# Patient Record
Sex: Male | Born: 1990 | Race: White | Hispanic: No | Marital: Single | Smoking: Current every day smoker
Health system: Southern US, Community
[De-identification: ages and names within clinical notes are randomized; demographics above are authoritative.]

## PROBLEM LIST (undated history)

## (undated) DIAGNOSIS — F909 Attention-deficit hyperactivity disorder, unspecified type: Secondary | ICD-10-CM

---

## 2015-11-11 ENCOUNTER — Encounter (HOSPITAL_COMMUNITY): Payer: Self-pay | Admitting: Emergency Medicine

## 2015-11-11 ENCOUNTER — Emergency Department (HOSPITAL_COMMUNITY)
Admission: EM | Admit: 2015-11-11 | Discharge: 2015-11-12 | Disposition: A | Discharge status: Home / Self Care | Payer: 59 | Attending: Emergency Medicine | Admitting: Emergency Medicine

## 2015-11-11 DIAGNOSIS — F909 Attention-deficit hyperactivity disorder, unspecified type: Secondary | ICD-10-CM | POA: Insufficient documentation

## 2015-11-11 DIAGNOSIS — F17200 Nicotine dependence, unspecified, uncomplicated: Secondary | ICD-10-CM | POA: Insufficient documentation

## 2015-11-11 DIAGNOSIS — R55 Syncope and collapse: Secondary | ICD-10-CM

## 2015-11-11 HISTORY — DX: Attention-deficit hyperactivity disorder, unspecified type: F90.9

## 2015-11-11 NOTE — ED Provider Notes (Signed)
[facility]-EMERGENCY DEPT Provider Note   CSN: [id number] Arrival date & time: 11/11/15  2322  By signing my name below, I, Phillis HaggisGabriella Gaje, attest that this documentation has been prepared under the direction and in the presence of Lyndal Pulleyaniel Nikelle Malatesta, MD. Electronically Signed: Phillis HaggisGabriella Gaje, ED Scribe. 11/11/15. 11:51 PM.  History   Chief Complaint Chief Complaint  Patient presents with  . Alcohol Intoxication    Syncope   The history is provided by the patient. No language interpreter was used.   HPI Comments: Jon Wolfe is a 25 y.o. male brought in by EMS who presents to the Emergency Department complaining of a brief episode of syncope s/p heavy alcohol use occurring PTA. Pt says that he was drinking all day and running around playing disc golf. His friend says that he has not eaten since noon. Friend called EMS because pt was urinating when he fell over and unaware of what he was doing. Pt says that he has had episodes in the past where he would "get up too fast then fall out," similar to his presentation tonight. Pt denies chest pain, abdominal pain, nausea, vomiting, or SOB.   Past Medical History:  Diagnosis Date  . ADHD     There are no active problems to display for this patient.   History reviewed. No pertinent surgical history.   Home Medications    Prior to Admission medications   Not on File    Family History No family history on file.  Social History Social History  Substance Use Topics  . Smoking status: Current Every Day Smoker  . Smokeless tobacco: Never Used  . Alcohol use Yes    Allergies   Patient has no known allergies.  Review of Systems Review of Systems  Respiratory: Negative for shortness of breath.   Cardiovascular: Negative for chest pain.  Gastrointestinal: Negative for abdominal pain, nausea and vomiting.  Neurological: Positive for syncope.  All other systems reviewed and are negative.  Physical Exam Updated Vital Signs BP  149/84 (BP Location: Right Arm)   Pulse (!) 57   Temp 98.4 F (36.9 C) (Oral)   Resp 16   Ht 6\' 5"  (1.956 m)   Wt 168 lb (76.2 kg)   SpO2 100%   BMI 19.92 kg/m   Physical Exam  Constitutional: He is oriented to person, place, and time. He appears well-developed and well-nourished. No distress.  HENT:  Head: Normocephalic and atraumatic.  Nose: Nose normal.  Eyes: Conjunctivae and EOM are normal. Pupils are equal, round, and reactive to light.  Neck: Neck supple. No tracheal deviation present.  Cardiovascular: Normal rate, regular rhythm, S1 normal, S2 normal, normal heart sounds and intact distal pulses.   No murmur heard. Pulmonary/Chest: Effort normal. No respiratory distress.  Abdominal: Soft. He exhibits no distension. There is no tenderness.  Neurological: He is alert and oriented to person, place, and time.  Skin: Skin is warm and dry.  Psychiatric: He has a normal mood and affect.  Nursing note and vitals reviewed.  ED Treatments / Results  DIAGNOSTIC STUDIES: Oxygen Saturation is 100% on RA, normal by my interpretation.    COORDINATION OF CARE: 11:50 PM-Discussed treatment plan which includes EKG with pt at bedside and pt agreed to plan.   Labs (all labs ordered are listed, but only abnormal results are displayed) Labs Reviewed - No data to display  EKG  EKG Interpretation  Date/Time:  Friday November 11 2015 23:52:32 EST Ventricular Rate:  76 PR Interval:  QRS Duration: 98 QT Interval:  434 QTC Calculation: 488 R Axis:   79 Text Interpretation:  Sinus rhythm Benign early repolarization Normal ECG Confirmed by Staci Carver MD, Rylyn Zawistowski ([id number]) on 11/11/2015 11:5([id number] PM       Radiology No results found.  Procedures Procedures (including critical care time)  Medications Ordered in ED Medications - No data to display   Initial Impression / Assessment and Plan / ED Course  I have reviewed the triage vital signs and the nursing notes.  Pertinent labs &  imaging results that were available during my care of the patient were reviewed by me and considered in my medical decision making (see chart for details).  Clinical Course     25 y.o. male presents with Syncopal event where he had been drinking wine and beer for most of the day while walking through the woods playing disc golf. He was urinating outside and was around friends, fell backwards and lost consciousness briefly and began moaning and urinated on himself. EKG interpreted by me without ST or T wave changes concerning for myocardial ischemia. No delta wave, no prolonged QTc, no brugada to suggest arrhythmogenicity.  He is well-appearing with a normal neurologic exam on arrival, tolerated fluids by mouth and was ambulatory prior to discharge. This likely represents an element of dehydration versus micturition syncope in a low risk demographic patient is otherwise healthy. Plan to follow up with PCP as needed and return precautions discussed for worsening or new concerning symptoms.   Final Clinical Impressions(s) / ED Diagnoses   Final diagnoses:  Syncope and collapse   I personally performed the services described in this documentation, which was scribed in my presence. The recorded information has been reviewed and is accurate.    New Prescriptions New Prescriptions   No medications on file     Lyndal Pulleyaniel Furqan Gosselin, MD 11/12/15 [phone]

## 2015-11-11 NOTE — ED Triage Notes (Signed)
Patient reports brief syncopal episode this evening and heavy ( ETOH) drinking today , alert and oriented at arrival , denies SOB , no pain or discomfort . No nausea or emesis .

## 2015-11-12 NOTE — ED Notes (Signed)
Paper scrubs / non slip socks given to pt.

## 2017-11-24 IMAGING — CT CT ABD-PELV W/ CM
2 of 4 series · 15 of 46 positions shown, 17 images · IV contrast (APPLIED)
Comparison: None.

CLINICAL DATA: Acute onset of right inguinal swelling and pain.
Initial encounter.

EXAM:
CT ABDOMEN AND PELVIS WITH CONTRAST
TECHNIQUE: Multidetector CT imaging of the abdomen and pelvis was performed
using the standard protocol following bolus administration of
intravenous contrast.
CONTRAST:  100mL [id number] IOPAMIDOL ([id number]) INJECTION 61%

[Series 2: abd/ pelvis 5.0 i30f 1 · axial · 0.87mm/px · z∈[+719,+1159]mm · 12 of 96 slices shown, 14 images]
[im 4/96  soft-tissue]
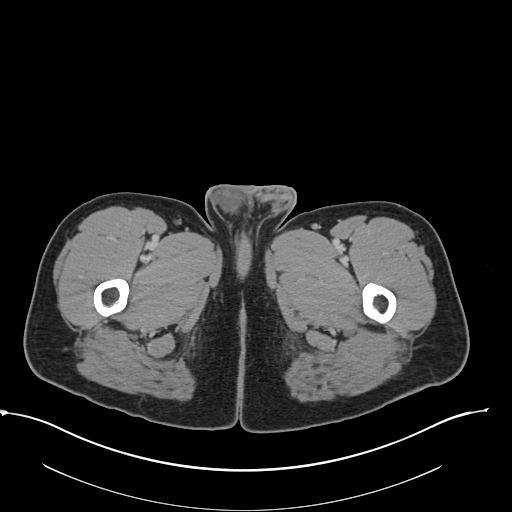
[im 4/96  bone]
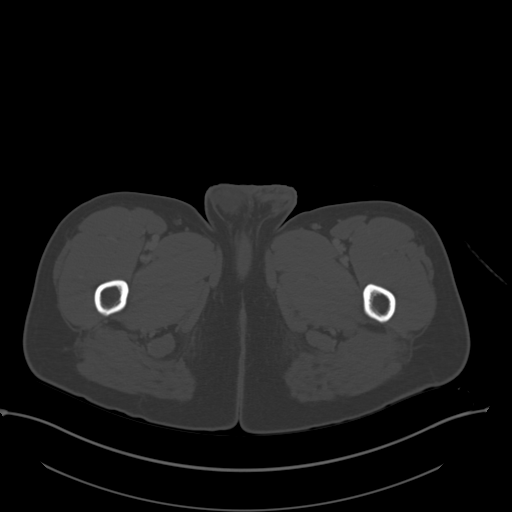
[im 12/96  soft-tissue]
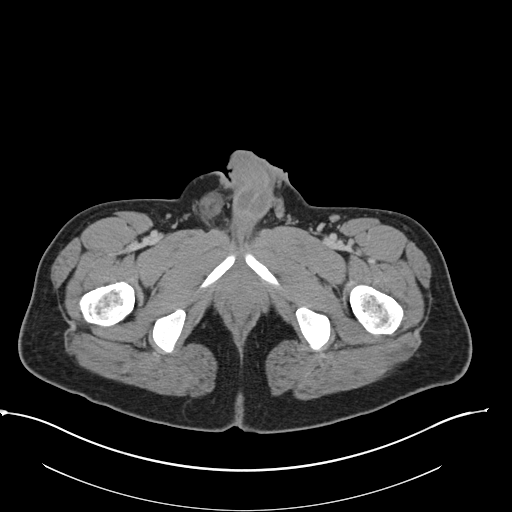
[im 20/96  soft-tissue]
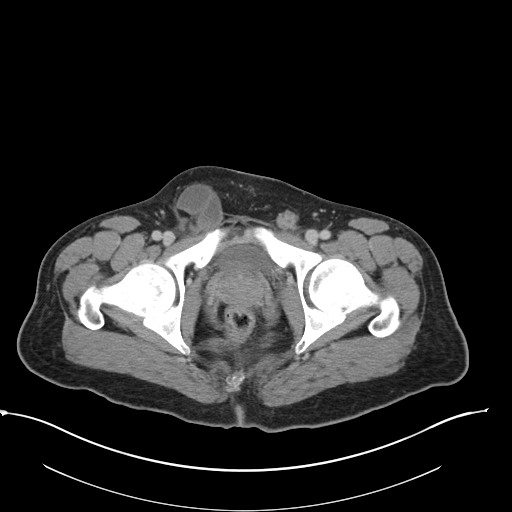
[im 28/96  soft-tissue]
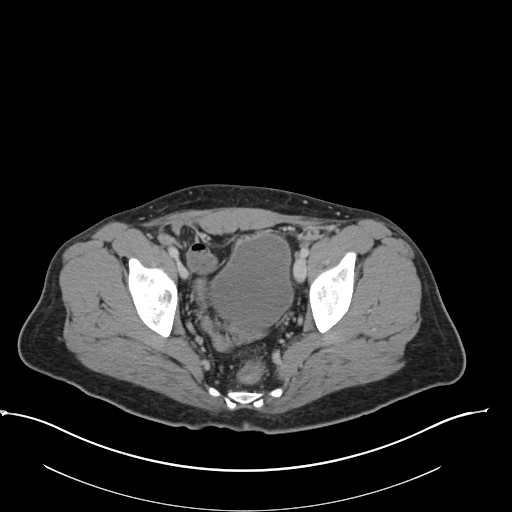
[im 36/96  soft-tissue]
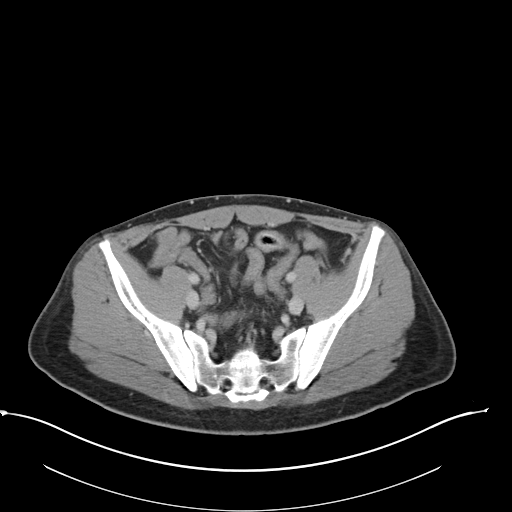
[im 44/96  soft-tissue]
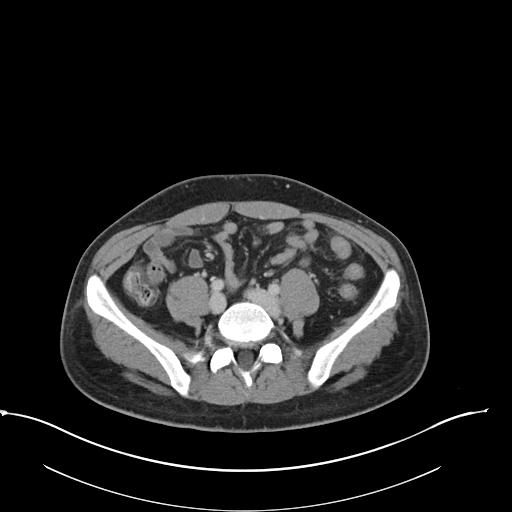
[im 52/96  soft-tissue]
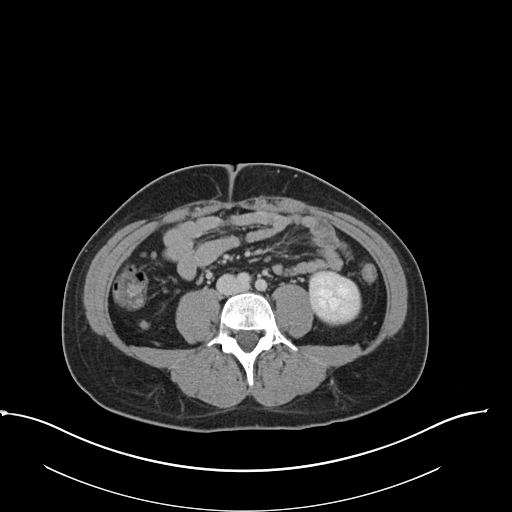
[im 60/96  soft-tissue]
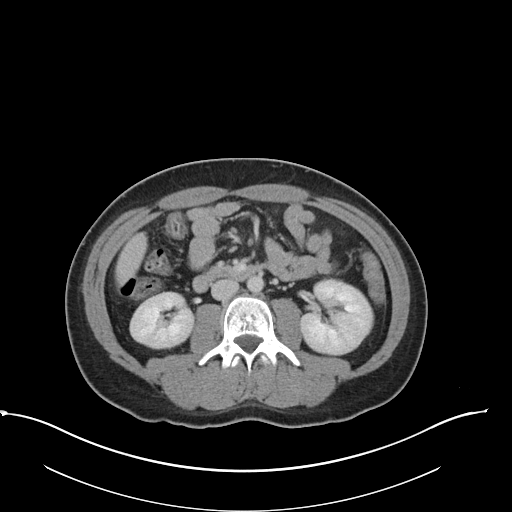
[im 68/96  soft-tissue]
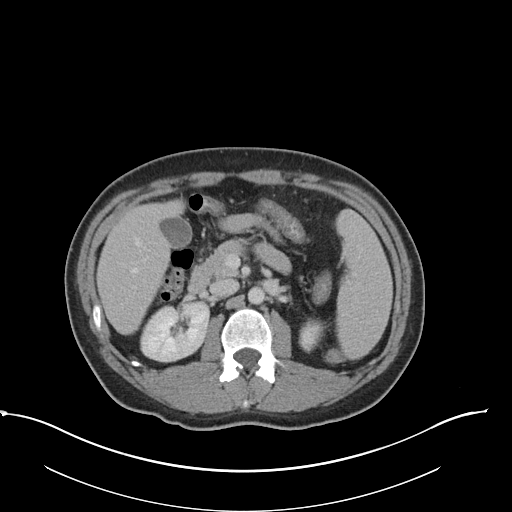
[im 68/96  bone]
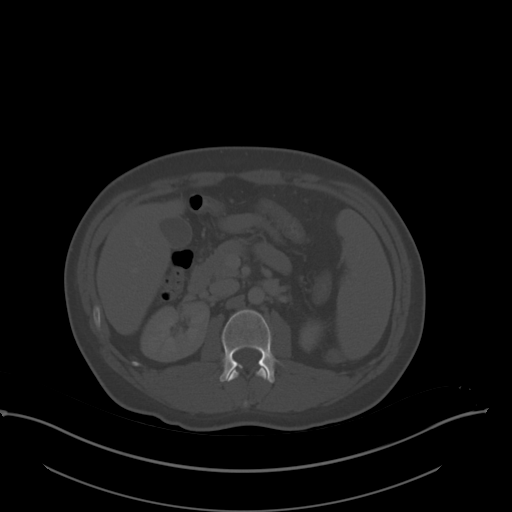
[im 76/96  soft-tissue]
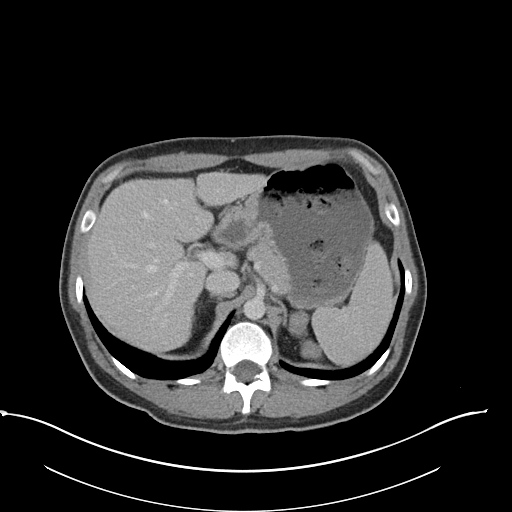
[im 84/96  soft-tissue]
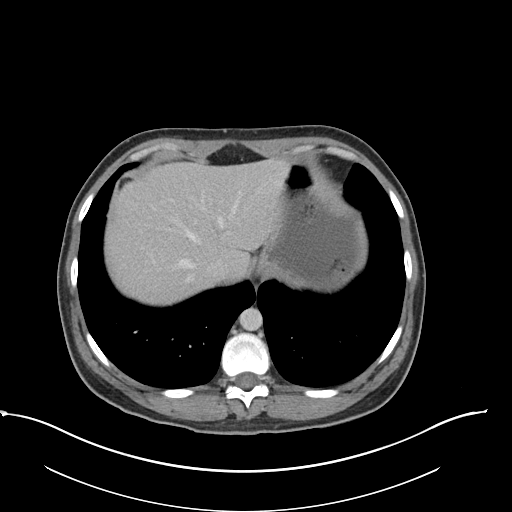
[im 92/96  soft-tissue]
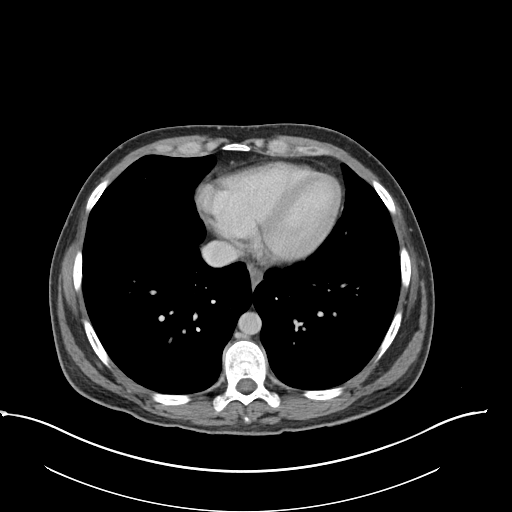

[Series 6: coronal soft tissue · coronal · 0.69mm/px · 3 of 89 slices shown]
[im 30/89  soft-tissue]
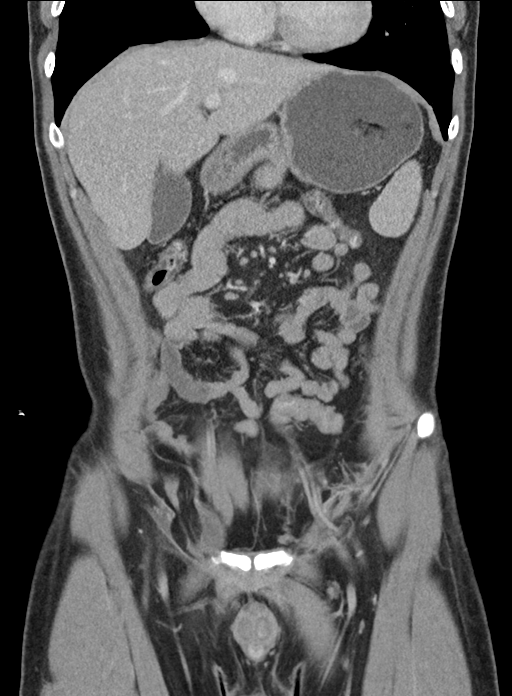
[im 40/89  soft-tissue]
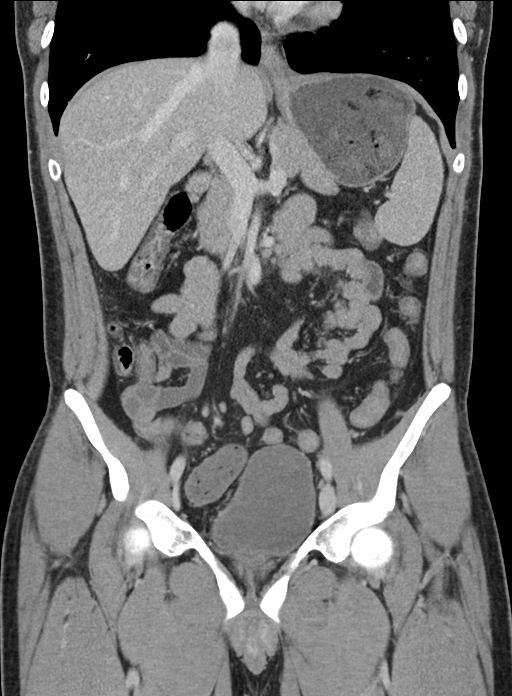
[im 49/89  soft-tissue]
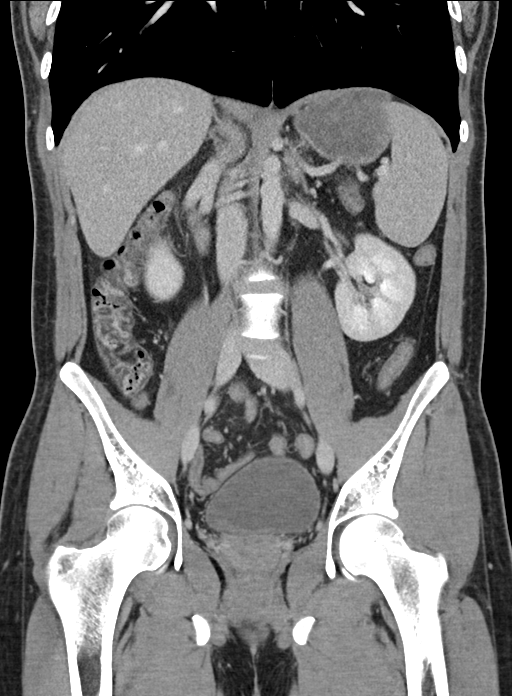

[15 of 46 positions shown; findings below may reference images not displayed]

FINDINGS: The visualized lung bases are clear.

The liver and spleen are unremarkable in appearance. The gallbladder
is within normal limits. The pancreas and adrenal glands are
unremarkable.

The kidneys are unremarkable in appearance. There is no evidence of
hydronephrosis. No renal or ureteral stones are seen. No perinephric
stranding is appreciated.

No free fluid is identified. The small bowel is unremarkable in
appearance. The stomach is within normal limits. No acute vascular
abnormalities are seen.

The appendix is normal in caliber, without evidence appendicitis.
The colon is largely decompressed and grossly unremarkable in
appearance.

The bladder is moderately distended and grossly unremarkable. The
prostate remains normal in size. No inguinal lymphadenopathy is
seen.

A small to moderate indirect right inguinal hernia is noted,
containing a short segment of distal ileum. The herniated loop is
mildly distended, measuring 3.1 cm in diameter, with mild
surrounding soft tissue inflammation, compatible with incarceration.
There is relative decompression distally, and relative decompression
proximally as well, without definite evidence of significant bowel
obstruction.

No acute osseous abnormalities are identified.
IMPRESSION: Small to moderate indirect right inguinal hernia, containing a short
segment of distal ileum. Mild distention of the herniated loop, with
mild surrounding soft tissue inflammation, compatible with
incarceration. No definite evidence of significant bowel
obstruction; most of the bowel is decompressed.

These results were called by telephone at the time of interpretation
on 08/10/2015 at [DATE] to Dr. FACECAYLAK ANTIMA , who verbally
acknowledged these results.
